# Patient Record
Sex: Female | Born: 1960 | Race: White | Hispanic: Yes | Smoking: Never smoker
Health system: Southern US, Community
[De-identification: ages and names within clinical notes are randomized; demographics above are authoritative.]

---

## 2016-12-14 ENCOUNTER — Emergency Department (HOSPITAL_COMMUNITY)
Admission: EM | Admit: 2016-12-14 | Discharge: 2016-12-14 | Disposition: A | Discharge status: Home / Self Care | Payer: No Typology Code available for payment source | Attending: Emergency Medicine | Admitting: Emergency Medicine

## 2016-12-14 ENCOUNTER — Emergency Department (HOSPITAL_COMMUNITY): Payer: No Typology Code available for payment source

## 2016-12-14 ENCOUNTER — Encounter (HOSPITAL_COMMUNITY): Payer: Self-pay | Admitting: *Deleted

## 2016-12-14 DIAGNOSIS — S098XXA Other specified injuries of head, initial encounter: Secondary | ICD-10-CM | POA: Insufficient documentation

## 2016-12-14 DIAGNOSIS — Y999 Unspecified external cause status: Secondary | ICD-10-CM | POA: Diagnosis not present

## 2016-12-14 DIAGNOSIS — Y939 Activity, unspecified: Secondary | ICD-10-CM | POA: Diagnosis not present

## 2016-12-14 DIAGNOSIS — Y92410 Unspecified street and highway as the place of occurrence of the external cause: Secondary | ICD-10-CM | POA: Insufficient documentation

## 2016-12-14 MED ORDER — TRAMADOL HCL 50 MG PO TABS
50.0000 mg | ORAL_TABLET | Freq: Four times a day (QID) | ORAL | 0 refills | Status: AC | PRN
Start: 2016-12-14 — End: ?

## 2016-12-14 MED ORDER — HYDROCODONE-ACETAMINOPHEN 5-325 MG PO TABS
1.0000 | ORAL_TABLET | Freq: Once | ORAL | Status: AC
  Administered 2016-12-14: 1 via ORAL
  Filled 2016-12-14: qty 1

## 2016-12-14 MED ORDER — IBUPROFEN 400 MG PO TABS: 400.0000 mg | ORAL_TABLET | Freq: Three times a day (TID) | ORAL | 0 refills | Status: AC

## 2016-12-14 MED ORDER — TRAMADOL HCL 50 MG PO TABS
50.0000 mg | ORAL_TABLET | Freq: Once | ORAL | Status: AC
  Administered 2016-12-14: 50 mg via ORAL
  Filled 2016-12-14: qty 1
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
## 2016-12-14 NOTE — ED Notes (Signed)
Walked patient to the bathroom patient did well patient back in bed with call bell in reach
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
## 2018-08-12 ENCOUNTER — Emergency Department
Admission: EM | Admit: 2018-08-12 | Discharge: 2018-08-12 | Disposition: A | Discharge status: Home / Self Care | Payer: 344 | Attending: Emergency Medical Services | Admitting: Emergency Medical Services

## 2018-08-12 DIAGNOSIS — R509 Fever, unspecified: Secondary | ICD-10-CM | POA: Insufficient documentation

## 2018-08-12 DIAGNOSIS — R197 Diarrhea, unspecified: Secondary | ICD-10-CM | POA: Insufficient documentation

## 2018-08-12 DIAGNOSIS — Z20828 Contact with and (suspected) exposure to other viral communicable diseases: Secondary | ICD-10-CM | POA: Insufficient documentation

## 2018-08-12 DIAGNOSIS — Z20822 Contact with and (suspected) exposure to covid-19: Secondary | ICD-10-CM

## 2018-08-12 MED ORDER — ACETAMINOPHEN 500 MG PO TABS
1000.0000 mg | ORAL_TABLET | Freq: Once | ORAL | Status: AC
Start: 2018-08-12 — End: 2018-08-12
  Administered 2018-08-12: 16:00:00 1000 mg via ORAL

## 2018-08-12 MED ORDER — SODIUM CHLORIDE 0.9 % IV BOLUS
1000.00 mL | Freq: Once | INTRAVENOUS | Status: DC
Start: 2018-08-12 — End: 2018-08-12

## 2018-08-12 NOTE — Discharge Instructions (Signed)
[facility] méd[facility] del departamento de emergencias de [place] para pacientes ambulatorios en espera del resultado de la prueba de la COVID-19     Gracias por elegir [facility]. Durante su visita, surgió la inquietud de una posible infección con el virus que produce la enfermedad COVID-19. Los resultados de la prueba para la COVID-19 tardarán de 3 a 7 días. Durante este período debe hacer todo lo siguiente:     ? Descanse y manténgase bien hidratado.  ? Use acetaminofén (Tylenol) para controlar la fiebre.  ? Lávese las manos con frecuencia.  - Lávese las manos con agua y jabón y con frecuencia por 20 segundos como mínimo, en especial después de soplarse la nariz, toser o estornudar, ir al baño y antes de comer o de cocinar.  - Si no tiene agua y jabón, use un desinfectante de manos con un mínimo de 60 % de alcohol, cubra toda la superficie de las manos y frótelas hasta que se sientan secas.  - El agua y el jabón son la mejor opción si las manos están visiblemente sucias. Evite tocarse los ojos, la nariz y la boca sin antes lavarse las manos.  ? Quédese en casa, excepto si [place] a recibir atención médica.  ? Llame con anticipación antes de ver a su médico o a cualquier otro proveedor médico o de buscar atención médica en cualquier centro de salud.  ? Aíslese tanto como sea posible. Los miembros de la familia de edad avanzada, las personas con enfermedades crónicas o las que toman medicamentos que afectan su sistema inmunológico corren un riesgo especial. Converse sobre su situación particular con su médico de cabecera.   ? Limpie todas las superficies de "alto contacto" todos los días.  - Las superficies de alto contacto incluyen encimeras, mesas, pomos de puertas, accesorios de baño, inodoros, teléfonos, teclados, tabletas y mesas de noche.  - Además, limpie cualquier superficie que pueda tener sangre, heces o fluidos corporales. Utilice un rociador o toallita de limpieza doméstica según las instrucciones de la  etiqueta.  - Las etiquetas contienen instrucciones para el uso seguro y efectivo del producto de limpieza, lo que incluye las precauciones que debe tener al aplicar el producto, como usar guantes y asegurarse de tener una buena ventilación durante el uso del producto.  - ¡Preste mucha atención a la limpieza de su teléfono celular durante todo el día!  ? La mayoría de las personas infectadas con la COVID-19 mejorarán, sin embargo, un pequeño número desarrollará una infección que empeora y que puede requerir un tratamiento más intensivo. Comuníquese con el médico o regrese al departamento de emergencias si desarrolla señales de advertencia de una infección más grave, como:   ? Una fiebre que no se controla con acetaminofén (Tylenol).  ? Dificultad para respirar o dolor en el pecho.  ? Empeoramiento de la tos.  ? Debilidad creciente.  ? Aspectos generales que debe tener en cuenta:  - Si tiene una emergencia médica y necesita llamar al 911, notifique al personal de despacho que tiene o está siendo evaluado por la COVID-19. Si es posible, póngase una mascarilla antes de que lleguen los servicios médicos de emergencia.  - Las personas que se sometan a un monitoreo activo o a una supervisión voluntaria facilitada deben seguir las instrucciones del departamento de salud local o de los profesionales de la salud ocupacional, según proceda.  - Antes de buscar atención, llame a su proveedor de atención médica y dígale que tiene o está siendo evaluado por la   COVID-19. Póngase una mascarilla antes de entrar en las instalaciones.  Estos pasos ayudarán a evitar que otras personas presentes en el consultorio del proveedor de atención médica o en la sala de espera se infecten o se expongan.   Llame a su supervisor en la escuela o el trabajo para obtener más instrucciones antes de volver a cualquier grupo mientras sufra los síntomas.    Recibo de los resultados de la prueba   Lo llamarán para informarle los resultados de su prueba de  la COVID-19. El Departamento de Salud de [place] o un miembro del equipo de [place] pueden llamarlo para darle este resultado, dependiendo del laboratorio que procese su prueba.      Asegúrese de que su información de contacto es correcta antes de salir del departamento de emergencias.      Descontinuar el aislamiento en el hogar  Los pacientes con la COVID-19 confirmada deben permanecer en aislamiento preventivo en casa hasta que se considere que el riesgo de transmisión secundaria a otros es bajo. La decisión de suspender el aislamiento preventivo en el hogar se debe tomar caso por caso, en consulta con proveedores de atención médica y los departamentos de salud estatales y locales. En general, se sugiere que espere, al menos, 3 días después de que todos los síntomas hayan desaparecido para volver a las actividades regulares.     Para obtener más información sobre lo que usted y los demás en su hogar deben hacer, visite el sitio web del Centro para el Control y la Prevención de Enfermedades (CDC, por sus siglas en inglés) en https://www.cdc.gov/coronavirus/2019-ncov/about/steps-when-sick.html       Clinics: [facility] [place] [facility] [place]  [place]  [facility]  [phone]  [address]  [place], [place] [address]  Fax: [phone]    [facility]  [phone]  [address]  [place], [place] [address]  Fax: [phone]    [facility] [place]  [phone]  [address]  3rd Floor  [place], [place] [address]  Fax: [phone]    [place]  [facility] [place]  [phone]   [address]  [place], [place] [address]  Fax: [phone]    [facility] - [place]  [phone]  [address], [place], [place] [address]  [address], [place], [place] [address]  [address], [place], [place] [address]  [address], [place], [place] [address]  Fax: [phone]    [facility] [place]  [phone]  [address]  [place], [place] [address]  Fax: [phone]    [facility] [place]  [phone]  [address]  [place], [place] [address]  Fax: [phone]    [place]  [facility] [place]  [phone]   [address]  [place], [place] [address]  Fax: [phone]     [facility]  [phone]  [employer]  [address]  [place], [place] [address]  Fax: [phone]    [facility] [place]  [phone]  [address]  [place], [place] [address]  Fax: [phone]    [place]  [facility]  [phone]  [address]  [place], [place] [address]  Fax: [phone]    [facility] [place] ([place])  [phone]   [address]  [place], [place] [address]  Fax: [phone]    [facility] [place] [facility] [place]    [phone]  [address] Third Floor  [place], [place] [address]    [place]  [facility] [place] [facility] [place]  [phone]  [address]  [place], [place] [address]     [facility]  [phone]  [address]  [place], [place] [address]  Fax: [phone]    [facility] [place]  [phone]  [address]  [place], [place] [address]  Fax: [phone]    [place]  [facility] [place]  [phone]  [address]  [place], [place] [address]  Fax: [phone]    [facility] [place]  [phone]  [address]  [place], [place] [address]  Fax: [phone]    [facility]  [phone]  [address]  [place], [place] [address]  Fax: [phone]    Outside [place]  [facility] [employer]  [phone]    [facility] [employer] - Patient Services   [phone]    [facility] - Patient  Relations  [phone]    Other Resources (non-primary care)  [facility] ([facility])  [address]  [place], [place] [address]  [phone]    [facility] (must be referred through certain agencies)  [place] and [place] locations  [phone]    [facility]  [address]  [place], [place] [address]  [phone]     [facility]  [address]  [place], [place] [address]  [phone]

## 2018-08-12 NOTE — ED Triage Notes (Signed)
Patient with diarrhea, fever and body aches past couple of days.  Alert x4.  No PMH

## 2018-08-12 NOTE — ED Provider Notes (Signed)
EMERGENCY DEPARTMENT HISTORY AND PHYSICAL EXAM     None        Date: 08/12/2018  Patient Name: Amanda Proctor  Attending Physician: He, Zadie Rhine, MD PhD  Advanced Practice Provider: Arnoldo Morale, PA-C    History of Presenting Illness       History Provided By: Patient  Interpreter: Spanish interpreter Amanda Proctor)    Chief Complaint:  Chief Complaint   Patient presents with    Fever    Diarrhea       HPI: Amanda Proctor is a 58 y.o. female otherwise healthy presenting to the ED with subjective fevers for the past six days. She also notes having a bitter taste in her mouth which she believes is associated with her fever, mild decreased appetite as a result. Pt's son is COVID positive and she wants to be tested. She last took Motrin at 11 AM today. Denies chest pain, SOB, abd pain, diarrhea, constipation.    PCP: Pcp, None, MD  SPECIALISTS:    No current facility-administered medications for this encounter.      No current outpatient medications on file.       Past History     Past Medical History:  No past medical history on file.    Past Surgical History:  History reviewed. No pertinent surgical history.    Family History:  No family history on file.    Social History:  Social History     Socioeconomic History    Marital status: Widowed     Spouse name: None    Number of children: None    Years of education: None    Highest education level: None   Occupational History    None   Social Engineer, site strain: None    Food insecurity:     Worry: None     Inability: None    Transportation needs:     Medical: None     Non-medical: None   Tobacco Use    Smoking status: Never Smoker    Smokeless tobacco: Never Used   Substance and Sexual Activity    Alcohol use: None    Drug use: None    Sexual activity: None   Lifestyle    Physical activity:     Days per week: None     Minutes per session: None    Stress: None   Relationships    Social connections:     Talks on phone: None      Gets together: None     Attends religious service: None     Active member of club or organization: None     Attends meetings of clubs or organizations: None     Relationship status: None    Intimate partner violence:     Fear of current or ex partner: None     Emotionally abused: None     Physically abused: None     Forced sexual activity: None   Other Topics Concern    None   Social History Narrative    None       Allergies:  No Known Allergies    Review of Systems     Review of Systems   Constitutional: Positive for fever.   Respiratory: Negative for shortness of breath.    Cardiovascular: Negative for chest pain.   Gastrointestinal: Negative for abdominal pain.       Physical Exam     Vitals:  08/12/18 1314   BP: 114/73   Pulse: 93   Resp: 15   Temp: 99.6 F (37.6 C)   TempSrc: Oral   SpO2: 97%   Weight: 81.6 kg       Physical Exam  Vitals signs and nursing note reviewed.   Constitutional:       Appearance: Normal appearance. She is well-developed.      Comments: Well-appearing, no acute distress.    HENT:      Head: Normocephalic and atraumatic.      Right Ear: External ear normal.      Left Ear: External ear normal.      Nose: Nose normal.   Eyes:      Pupils: Pupils are equal, round, and reactive to light.   Neck:      Musculoskeletal: Normal range of motion and neck supple.   Cardiovascular:      Rate and Rhythm: Normal rate and regular rhythm.      Pulses: Normal pulses.      Heart sounds: Normal heart sounds.   Pulmonary:      Effort: Pulmonary effort is normal.      Breath sounds: Normal breath sounds.   Abdominal:      Palpations: Abdomen is soft.      Tenderness: There is no abdominal tenderness.   Musculoskeletal: Normal range of motion.   Skin:     General: Skin is warm and dry.   Neurological:      Mental Status: She is alert and oriented to person, place, and time.   Psychiatric:         Behavior: Behavior normal.         Diagnostic Study Results     Labs -     Results     ** No results  found for the last 24 hours. **          Radiologic Studies -   Radiology Results (24 Hour)     ** No results found for the last 24 hours. **      .    Medical Decision Making   I am the first provider for this patient.    I reviewed the vital signs, available nursing notes, past medical history, past surgical history, family history and social history.    Vital Signs-Reviewed the patient's vital signs.     Patient Vitals for the past 12 hrs:   BP Temp Pulse Resp   08/12/18 1314 114/73 99.6 F (37.6 C) 93 15       Pulse Oximetry Analysis - Normal SpO2: 97 % on RA        Procedures:       Old Medical Records: Nursing notes.     ED Course:        Provider Notes:   58yoF presents with subjective fever x 6 days and covid+ contacts for a COVID test. Denies CP, SOB, weakness. Change in sense of taste but otherwise asymptomatic. COVID test sent, return precautions discussed     Diagnosis     Clinical Impression:   1. Suspected Covid-19 Virus Infection        Treatment Plan:   ED Disposition     ED Disposition Condition Date/Time Comment    Discharge  Mon Aug 12, 2018  3:51 PM [facility] discharge to home/self care.    Condition at disposition: Stable            _______________________________    CHART OWNERSHIP:  I, Zoye Chandra Samo-Lipman, PA-C, am the primary clinician of record.  _______________________________     Arnoldo Morale, [place]  08/12/18 2209

## 2018-08-13 LAB — CORONAVIRUS, COVID-19: SARS CoV 2 Overall Result: NOT DETECTED

## 2018-08-14 ENCOUNTER — Telehealth (INDEPENDENT_AMBULATORY_CARE_PROVIDER_SITE_OTHER): Payer: Self-pay

## 2018-08-14 ENCOUNTER — Emergency Department: Payer: 344

## 2018-08-14 ENCOUNTER — Emergency Department
Admission: EM | Admit: 2018-08-14 | Discharge: 2018-08-14 | Disposition: A | Discharge status: Home / Self Care | Payer: 344 | Attending: Emergency Medicine | Admitting: Emergency Medicine

## 2018-08-14 DIAGNOSIS — Z20828 Contact with and (suspected) exposure to other viral communicable diseases: Secondary | ICD-10-CM | POA: Insufficient documentation

## 2018-08-14 DIAGNOSIS — R6889 Other general symptoms and signs: Secondary | ICD-10-CM

## 2018-08-14 DIAGNOSIS — R05 Cough: Secondary | ICD-10-CM | POA: Insufficient documentation

## 2018-08-14 DIAGNOSIS — R0602 Shortness of breath: Secondary | ICD-10-CM | POA: Insufficient documentation

## 2018-08-14 DIAGNOSIS — Z20822 Contact with and (suspected) exposure to covid-19: Secondary | ICD-10-CM

## 2018-08-14 DIAGNOSIS — R509 Fever, unspecified: Secondary | ICD-10-CM | POA: Insufficient documentation

## 2018-08-14 DIAGNOSIS — I517 Cardiomegaly: Secondary | ICD-10-CM

## 2018-08-14 LAB — ECG 12-LEAD
Atrial Rate: 88 {beats}/min
P Axis: 12 degrees
P-R Interval: 158 ms
Q-T Interval: 358 ms
QRS Duration: 70 ms
QTC Calculation (Bezet): 433 ms
R Axis: 4 degrees
T Axis: 96 degrees
Ventricular Rate: 88 {beats}/min

## 2018-08-14 LAB — CBC AND DIFFERENTIAL
Absolute NRBC: 0 10*3/uL (ref 0.00–0.00)
Basophils Absolute Automated: 0.01 10*3/uL (ref 0.00–0.08)
Basophils Automated: 0.1 %
Eosinophils Absolute Automated: 0 10*3/uL (ref 0.00–0.44)
Eosinophils Automated: 0 %
Hematocrit: 35.8 % (ref 34.7–43.7)
Hgb: 12 g/dL (ref 11.4–14.8)
Immature Granulocytes Absolute: 0.04 10*3/uL (ref 0.00–0.07)
Immature Granulocytes: 0.5 %
Lymphocytes Absolute Automated: 1.56 10*3/uL (ref 0.42–3.22)
Lymphocytes Automated: 19.5 %
MCH: 29.6 pg (ref 25.1–33.5)
MCHC: 33.5 g/dL (ref 31.5–35.8)
MCV: 88.4 fL (ref 78.0–96.0)
MPV: 9.7 fL (ref 8.9–12.5)
Monocytes Absolute Automated: 0.29 10*3/uL (ref 0.21–0.85)
Monocytes: 3.6 %
Neutrophils Absolute: 6.1 10*3/uL (ref 1.10–6.33)
Neutrophils: 76.3 %
Nucleated RBC: 0 /100 WBC (ref 0.0–0.0)
Platelets: 215 10*3/uL (ref 142–346)
RBC: 4.05 10*6/uL (ref 3.90–5.10)
RDW: 12 % (ref 11–15)
WBC: 8 10*3/uL (ref 3.10–9.50)

## 2018-08-14 LAB — COMPREHENSIVE METABOLIC PANEL
ALT: 36 U/L (ref 0–55)
AST (SGOT): 34 U/L (ref 5–34)
Albumin/Globulin Ratio: 1 (ref 0.9–2.2)
Albumin: 3.4 g/dL — ABNORMAL LOW (ref 3.5–5.0)
Alkaline Phosphatase: 92 U/L (ref 37–106)
BUN: 8 mg/dL (ref 7.0–19.0)
Bilirubin, Total: 0.4 mg/dL (ref 0.2–1.2)
CO2: 21 mEq/L — ABNORMAL LOW (ref 22–29)
Calcium: 8 mg/dL — ABNORMAL LOW (ref 8.5–10.5)
Chloride: 106 mEq/L (ref 100–111)
Creatinine: 0.7 mg/dL (ref 0.6–1.0)
Globulin: 3.5 g/dL (ref 2.0–3.6)
Glucose: 113 mg/dL — ABNORMAL HIGH (ref 70–100)
Potassium: 4.1 mEq/L (ref 3.5–5.1)
Protein, Total: 6.9 g/dL (ref 6.0–8.3)
Sodium: 139 mEq/L (ref 136–145)

## 2018-08-14 LAB — GFR: EGFR: >60

## 2018-08-14 LAB — PT AND APTT
PT INR: 1 (ref 0.9–1.1)
PT: 13.5 s (ref 12.6–15.0)
PTT: 30 s (ref 23–37)

## 2018-08-14 LAB — TROPONIN I: Troponin I: <0.01 ng/mL (ref 0.00–0.05)

## 2018-08-14 LAB — B-TYPE NATRIURETIC PEPTIDE: B-Natriuretic Peptide: 68 pg/mL (ref 0–100)

## 2018-08-14 LAB — LACTIC ACID, PLASMA: Lactic Acid: 1.2 mmol/L (ref 0.2–2.0)

## 2018-08-14 LAB — IHS D-DIMER: D-Dimer: 0.55 ug/mL FEU (ref 0.00–0.70)

## 2018-08-14 LAB — MAGNESIUM: Magnesium: 1.7 mg/dL (ref 1.6–2.6)

## 2018-08-14 NOTE — ED Triage Notes (Signed)
Pt reports being tested for COVID-19 two days ago with negative result.  Pt reports difficulty performing simple ADLs due to fatigue and sob.

## 2018-08-14 NOTE — ED Provider Notes (Signed)
[facility] EMERGENCY DEPARTMENT H&P      Visit date: 08/14/2018      CLINICAL SUMMARY           Diagnosis:    .     Final diagnoses:   Suspected 2019 Novel Coronavirus Infection         MDM Notes:      51F with fever, cough, sore throat. + sick contacts. Likely COVID. No other localizing symptoms.     No indication for oxygen or admission at this point in time. Discussed isolation, returned for increased work of breathing. D/c'd in stable condition.          Disposition:         Discharge           I was present for the bedside discharge alongside the patient's ED nurse. Course of visit in the ED and discharge instructions were reviewed with patient and they were given the opportunity to ask any questions regarding their care today. Patient and/ or patient's family verbalized understanding of, and comfort with, instructions and plan.          Discharge Prescriptions     None                         CLINICAL INFORMATION        HPI:      Chief Complaint: Shortness of Breath; Fever; and Diarrhea  .    Aneita Proctor is a 58 y.o. female who presents with fever, cough, shortness of breath. Symptoms x  8 days. Tested at [place] and negative. Continued fevers. Son is COVID +. Also with decreased appetite and had reported looser stools at [facility] visit.     No smoking,     Denies medications, chronic medical conditions.       History obtained from: Patient with interpreter.           ROS:      Pertinent positive and negative review of systems elements noted in the HPI.  All other systems reviewed and negative.        Physical Exam:      Vitals:    08/14/18 0938 08/14/18 0952 08/14/18 1130 08/14/18 1230   BP:  126/69 143/79 112/65   Pulse: (!) 106 86 85 87   Resp:  (!) 28 (!) 24 18   Temp:  100.2 F (37.9 C)  100.4 F (38 C)   TempSrc:  Oral  Oral   SpO2: 92% 97% 94% 96%   Weight:       Height:           Nursing notes and vitals reviewed.     Constitutional: alert, no acute  distress  Eyes: EOM intact, conjugate gaze, visual acuity grossly intact  HEENT: Neck supple with normal ROM  Cardiac: Warm and well perfused  Respiratory: Normal rate, No increased work of breathing   Abdomen/GI:  nondistended  Neurologic: alert, oriented, conversant, moves all extremities, stable gait  MSK: No deformities or crepitus, full ROM of joints without pain or limitation  Skin: warm and dry, no obvious rashes  Psychiatric: pleasant and cooperative              PAST HISTORY        Primary Care Provider: Pcp, None, MD        PMH/PSH:    .     History reviewed. No pertinent past medical history.  She has no past surgical history on file.      Social/Family History:      She reports that she has never smoked. She has never used smokeless tobacco. She reports that she does not drink alcohol or use drugs.    History reviewed. No pertinent family history.      Listed Medications on Arrival:    .     Home Medications     Med List Status:  In Progress Set By: Lovey Newcomer, RN at 08/14/2018  9:35 AM        No Medications         Allergies: She has No Known Allergies.            VISIT INFORMATION        Clinical Course in the ED:          ED Course as of Aug 15 1298   Wed Aug 14, 2018   1101 Fever x 11 days, cough, some Sob. + sick contacts. Tested negative a few days ago.     Since has continued to have fever, some cough, bitter taste in mouth. Son + for COVID and lives with her.     Denies other localizing symtpoms.     No smoking, no diabetes, no htn.     [RS]      ED Course User Index  [RS] Wandra Feinstein, MD         Medications Given in the ED:    .     ED Medication Orders (From admission, onward)    None            Procedures:      Procedures      Interpretations:      O2 sat-           saturation: 95 %; Oxygen use: room air; Interpretation: borderline normal       Radiology -     interpreted by me with the following observations: CXR with mild b/L opacities c/w COVID  EKG -             interpreted by  me: Sinus rhythm at 88, T wave inversion in aVL, no acute ST or T wave changes, normal intervals, normal axis  Monitor -         interpreted by me: ST in 100s                 RESULTS        Lab Results:      Results     Procedure Component Value Units Date/Time    Troponin I [[id number]] Collected:  08/14/18 1029    Specimen:  Blood Updated:  08/14/18 1145     Troponin I <0.01 ng/mL     B-type Natriuretic Peptide [[id number]] Collected:  08/14/18 1029    Specimen:  Blood Updated:  08/14/18 1145     B-Natriuretic Peptide 68 pg/mL     Comprehensive metabolic panel [[id number]]  (Abnormal) Collected:  08/14/18 1029    Specimen:  Blood Updated:  08/14/18 1137     Glucose 113 mg/dL      BUN 8.0 mg/dL      Creatinine 0.7 mg/dL      Sodium 295 mEq/L      Potassium 4.1 mEq/L      Chloride 106 mEq/L      CO2 21 mEq/L      Calcium 8.0 mg/dL      Protein,  Total 6.9 g/dL      Albumin 3.4 g/dL      AST (SGOT) 34 U/L      ALT 36 U/L      Alkaline Phosphatase 92 U/L      Bilirubin, Total 0.4 mg/dL      Globulin 3.5 g/dL      Albumin/Globulin Ratio 1.0    Magnesium [[id number]] Collected:  08/14/18 1029    Specimen:  Blood Updated:  08/14/18 1137     Magnesium 1.7 mg/dL     GFR [[id number]] Collected:  08/14/18 1029     Updated:  08/14/18 1137     EGFR >60.0    D-Dimer [[id number]] Collected:  08/14/18 1029     Updated:  08/14/18 1122     D-Dimer 0.55 ug/mL FEU     Rapid influenza A/B antigens [[id number]] Collected:  08/14/18 1029    Specimen:  Nasopharyngeal Swab from Nasopharynx Updated:  08/14/18 1120    Narrative:       ORDER#: [id number]                                    ORDERED BY: Vickki Hearing, Avrohom Mckelvin  SOURCE: Nasopharynx                                  COLLECTED:  08/14/18 10:29  ANTIBIOTICS AT COLL.:                                RECEIVED :  08/14/18 11:03  Influenza Rapid Antigen A&B                FINAL       08/14/18 11:20  08/14/18   Negative for Influenza A and B             Reference Range: Negative      PT/APTT [[id number]]  Collected:  08/14/18 1029     Updated:  08/14/18 1120     PT 13.5 sec      PT INR 1.0     PTT 30 sec     CBC and differential [[id number]] Collected:  08/14/18 1029    Specimen:  Blood Updated:  08/14/18 1110     WBC 8.00 x10 3/uL      Hgb 12.0 g/dL      Hematocrit 56.3 %      Platelets 215 x10 3/uL      RBC 4.05 x10 6/uL      MCV 88.4 fL      MCH 29.6 pg      MCHC 33.5 g/dL      RDW 12 %      MPV 9.7 fL      Neutrophils 76.3 %      Lymphocytes Automated 19.5 %      Monocytes 3.6 %      Eosinophils Automated 0.0 %      Basophils Automated 0.1 %      Immature Granulocyte 0.5 %      Nucleated RBC 0.0 /100 WBC      Neutrophils Absolute 6.10 x10 3/uL      Abs Lymph Automated 1.56 x10 3/uL      Abs Mono Automated 0.29 x10 3/uL      Abs Eos Automated 0.00 x10 3/uL  Absolute Baso Automated 0.01 x10 3/uL      Absolute Immature Granulocyte 0.04 x10 3/uL      Absolute NRBC 0.00 x10 3/uL     Lactic acid, plasma [[id number]] Collected:  08/14/18 1029    Specimen:  Blood Updated:  08/14/18 1055     Lactic acid 1.2 mmol/L               Radiology Results:      XR Chest AP Only   Final Result    Bibasilar infiltrates.      Nasim Garofano    08/14/2018 10:21 AM                  Scribe Attestation:      No scribe was involved in the care of this patient.             Wandra Feinstein, MD  08/16/18 [phone]

## 2018-08-14 NOTE — ED Notes (Signed)
X-ray at bedside

## 2018-08-14 NOTE — Telephone Encounter (Signed)
COVID-19 Test Results Notification Team    Spoke with patient to provide NEGATIVE COVID-19 test result.      After verifying patient name, address and DOB, patient was provided with the negative result.    Patient's questions were answered and patient was provided with the COVID-19 Results Call Center number in the event that patient has any additional questions or concerns.     Confirmed that patient's primary care physician is Ramond Marrow at phone number [phone].  Routed Epic note to primary care physician.     Patient was advised to follow CDC guidance on social distancing and hand washing.    Advised patient to contact their Primary Care Physician in the event that patient's symptoms change or worsen.      Reminded patient that the emergency room is always available in the event of an emergency.      Brenton Grills  LCSW-A, CSAC  COVID-19 Notification Team  Direct Line:  [phone] Test Results Call Center:  [phone]

## 2018-08-14 NOTE — Discharge Instructions (Signed)
Gracias por elegir el departamento de emergencias (Emergency Department) de [facility], el primero en el área de [place].  Espero que su consulta de hoy haya sido EXCELENTE.      Thank you for choosing the [facility] Emergency Department, the premier emergency department in the [place] area.  I hope your visit today was EXCELLENT.                       Instrucciones específicas para su consulta de hoy:      Specific instructions for your visit today:        Instrucciones del alta para la COVID-19     Gracias por elegir [facility].  Durante su consulta, su equipo de atención médica tuvo la preocupación de que pudiera estar infectado con el virus que causa la enfermedad del coronavirus 2019 (COVID-19).  Se le realizó la prueba para detectar este virus y es posible que ya se tengan los resultados.    ¿Qué es la COVID-19?  La COVID-19 es la enfermedad causada por un coronavirus llamado Síndrome Respiratorio Agudo Grave por Coronavirus 2 (SARS-CoV-2, por sus siglas en inglés). El virus es muy contagioso y se puede propagar a través de la tos, los estornudos e incluso al hablar.  También se puede propagar mediante el contacto personal cercano o a través de superficies contaminadas.  Se cree que el virus que causa la COVID-19 puede permanecer en objetos y superficies durante varios días, pero no se sabe con certeza la cantidad de tiempo que permanece infeccioso.  Debido a cómo el virus entra en el cuerpo, es importante evitar tocarse la cara, la boca y la nariz.  También es importante lavarse las manos o utilizar desinfectante de manos frecuentemente y limpiar a fondo las superficies de alto contacto.  El distanciamiento social y el aislamiento son clave para disminuir la propagación del virus.    ¿Cómo recibiré los resultados de mi prueba?   Si durante la hospitalización se le realizó la prueba para la COVID-19 y el resultado sigue pendiente, recibirá una llamada por parte del equipo de  notificación de resultados de la COVID-19 en cuanto el resultado final esté listo.  Asegúrese de que el número de teléfono que dio cuando se registró sea el correcto para que puedan comunicarse con usted.  También puede registrarse en My Chart para consultar el resultado en línea cuando esté disponible.  Le pedimos amablemente que tenga paciencia, ya que los resultados de su prueba para la COVID-19 pueden tardar varios días en ser procesados.  Si desea hablar con un miembro del equipo, puede comunicarse con el centro de llamadas del hospital para conocer resultados de pruebas para la COVID-19 a través del [phone], de lunes a viernes, de 8:30 a. m. a 5:00 p. m.    Cuidado en el hogar - Aislamiento:  ? Quédese en casa, excepto si [place] a recibir atención médica. Debe restringir todas las actividades fuera de casa. No vaya a su lugar de trabajo, la escuela o lugares públicos. Evite el uso del transporte público, transporte compartido o tomar taxis.  ? Aíslese de las personas que viven en su casa tanto como sea posible.  Las personas infectadas deben aislarse de los demás que viven en la misma casa, instalándose en un dormitorio o espacio específico "para el enfermo" y utilizando un baño diferente (si es posible).  ? Los familiares de la tercera edad, las personas con enfermedades crónicas como la diabetes y   aquellas que toman medicamentos que afectan su sistema inmunológico corren un riesgo especialmente alto.  Infórmele al médico de atención primaria de las personas que viven con usted que corran un riesgo más alto sobre la posibilidad de que usted esté infectado.  ? Mientras esté enfermo de la COVID-19, debe evitar el contacto con mascotas y otros animales.  No se sabe con certeza si las mascotas u otros animales pueden contraer la COVID-19 o propagar el virus.  Se recomienda que las personas con COVID-19 eviten el contacto con animales hasta que se tenga más información sobre el virus.  ? Póngase una mascarilla de  tela cuando deba salir a lugares públicos. Para obtener instrucciones sobre cómo elaborar su propia mascarilla, visite https://www.cdc.gov/coronavirus/2019-ncov/prevent-getting-sick/diy-cloth-face-coverings.html.  ? Llame con anticipación antes de ver a su médico o a cualquier otro proveedor médico o de buscar atención médica en cualquier centro de salud.  Debe informarles que le diagnosticaron COVID-19 o le están haciendo pruebas para detectar la enfermedad. SI LLAMA AL 911, INFORME QUE LE DIAGNOSTICARON COVID-19 O LE ESTÁN HACIENDO PRUEBAS PARA DETECTAR LA ENFERMEDAD.    Cuidado del hogar - limpieza:  ? Evite tocarse los ojos, la nariz, la boca u otras partes de la cara.  ? Lávese las manos con frecuencia, especialmente después de sonarse la nariz, toser, estornudar, ir al baño y antes de comer o preparar alimentos.  -      Frótese las manos con agua y jabón durante, al menos, 20 segundos.  El agua y el jabón son la mejor opción para lavarse las manos si las tiene visiblemente sucias.  -      Si no puede lavarse con agua y jabón, use un desinfectante de manos a base de alcohol con, al menos, un 60 % de alcohol y cubra toda la superficie de las manos frotándolas hasta que se sientan secas.  ? Evite compartir artículos de uso personal con otras personas. No comparta platos, vasos, tazas, cubiertos, toallas o ropa de cama con otras personas o mascotas en su casa. Estos artículos se deben lavar bien con agua y jabón después de cada uso.  ? Limpie todas las superficies "de alto contacto" todos los días con un desinfectante en aerosol o toallitas húmedas desinfectantes según las instrucciones de la etiqueta.  - Las superficies de alto contacto incluyen encimeras, mesas, pomos de puertas, accesorios de baño, inodoros, teléfonos, teclados, tabletas y mesas de noche. ¡Preste mucha atención a la limpieza de su teléfono celular durante todo el día!  -     Limpie cualquier superficie que pueda tener sangre, excremento u otros  fluidos corporales.    Cuidado en el hogar - manejo de los síntomas:  ? Descanse y evite actividad física intensa.  ? La fiebre se puede tratar con acetaminofén (Tylenol). No exceda la dosis total recomendada en la etiqueta del medicamento.  Evite los medicamentos antiinflamatorios no esteroideos (NSAID, por sus siglas en inglés) como ibuprofeno (Motrin, Advil, etc.) y naproxeno (Naprosyn, Aleve, etc.).  ? Es posible que su equipo de atención médica le haya recomendado medicamentos para la tos.  Antes de comenzar a tomar cualquier otro medicamento de venta libre para la tos o el resfriado debe ponerse en contacto con un proveedor médico.      ¿Qué se debe tener en cuenta?  La mayoría de las personas con la COVID-19 se recuperan, y muchas presentan solo síntomas leves. Sin embargo, un número importante desarrolla infecciones y síntomas graves que requieren monitoreo constante y tratamiento en   el hospital.  Comuníquese con su médico o regrese al departamento de emergencias si desarrolla señales de advertencia de una infección más grave, como:   ? Una fiebre alta que no se logra controlar con acetaminofén (Tylenol).  ? Dificultad para respirar que empeora y que le impide realizar actividades que normalmente podría hacer.   ? Debilidad y fatiga agudas.  ? Dolor en el pecho que empeora.   ? Empeoramiento de la tos.  ? Labios que se tornan azules, blancos o grises.  ? Confusión o somnolencia de reciente aparición.  ? Síntomas parecidos a los de un ataque cerebral como adormecimiento o debilidad repentina en la cara, los brazos o las piernas, confusión, problemas para ver o caminar, pérdida de equilibrio o dolor de cabeza repentino e intenso.  ? Decoloración azul, blanca o gris de reciente aparición en la piel y sensación de frío en las extremidades (brazos, manos, piernas y pies).  ? Esta lista no incluye todos los síntomas. Consulte con su proveedor médico sobre cualquier otro síntoma grave que  presente.      Descontinuar el aislamiento domiciliario  Los pacientes confirmados con COVID-19 deben permanecer en aislamiento domiciliario hasta que se considere que el riesgo de contagiar a otras personas es muy bajo. Para dar fin al aislamiento domiciliario, los Centros para el Control y la Prevención de Enfermedades (CDC, por sus siglas en inglés) recomiendan que se cumpla con todos los siguientes criterios:    ? No haber presentado fiebre durante 72 horas como mínimo (es decir, tres días completos sin fiebre y sin tomar acetaminofén)  Y  ? que otros síntomas hayan mejorado (por ejemplo, mejoría de la tos o de la dificultad para respirar)  Y   ? que hayan pasado, al menos, 10 días desde que aparecieron los síntomas por primera vez.    Es posible que haya otras recomendaciones o restricciones estatales o locales vigentes para el aislamiento que debe respetar.    Si no está seguro si puede interrumpir su aislamiento domiciliario, comuníquese con su médico de atención primaria para recibir orientación.    Para obtener más información sobre lo que usted y los demás en su hogar deben hacer, visite el sitio web de los Centros para el Control y la Prevención de Enfermedades (CDC) en https://www.cdc.gov/coronavirus/2019-ncov/about/steps-when-sick.html.          SI USTED NO SIGUE MEJORANDO O SI SU CONDICIÓN EMPEORA, COMUNÍQUESE CON SU MÉDICO O ACUDA DE INMEDIATO AL DEPARTAMENTO DE EMERGENCIAS.        IF YOU DO NOT CONTINUE TO IMPROVE OR YOUR CONDITION WORSENS, PLEASE CONTACT YOUR DOCTOR OR RETURN IMMEDIATELY TO THE EMERGENCY DEPARTMENT.                    Atentamente,  Mathilda Maguire G, MD  Médico especialista en emergencias  Departamento de emergencias de [facility]        Sincerely,  Astin Rape G, MD  Attending Emergency Physician  [facility] Emergency Department                FARMACIA EN EL HOSPITAL  Nuestro servicio de farmacia en el hospital se encuentra en el área de espera de la  sala de emergencias.  Abre los 7 días de la semana de 9 am a 9 pm.  Trabajamos con los seguros médicos más importantes y nuestros precios son competitivos en comparación con los de otros proveedores.  Pida a su proveedor que imprima su prescripción para la farmacia,   de modo que pueda llegar a casa más rápido.     ONSITE PHARMACY  Our full service onsite pharmacy is located in the ER waiting room.  Open 7 days a week from 9 am to 9 pm.  We accept all major insurances and prices are competitive with major retailers.  Ask your provider to print your prescriptions down to the pharmacy to speed you on your way home.             CÓMO OBTENER UNA CITA PARA RECIBIR ATENCIÓN MÉDICA PRIMARIA    Los médicos de atención primaria (PCP, por sus siglas en inglés) son internistas o médicos de cabecera. Ambos tipos de PCP se enfocan en el fomento de la salud, la prevención de enfermedades, la educación y la asesoría del paciente y el tratamiento de condiciones médicas agudas y crónicas.    Llame para concertar una cita con un médico de atención primaria.  Pregunte qué médico está recibiendo pacientes nuevos.     [facility]  Teléfono: [phone]  [email or web address]     OBTAINING A PRIMARY CARE APPOINTMENT    Primary care physicians (PCPs, also known as primary care doctors) are either internists or family medicine doctors. Both types of PCPs focus on health promotion, disease prevention, patient education and counseling, and treatment of acute and chronic medical conditions.    Call for an appointment with a primary care doctor.  Ask to see who is taking new patients.       [facility]  telephone:  [phone]  [email or web address]             REMISIONES A MÉDICOS  Llame al [phone] (disponible las 24 horas al día, los 7 días de la semana) si necesita una remisión y podremos ayudarlo a encontrar un médico de atención primaria o un especialista.  También está disponible en línea en la página web:  [email or web address]     DOCTOR REFERRALS  Call [phone] (available 24 hours a day, 7 days a week) if you need any further referrals and we can help you find a primary care doctor or specialist.  Also, available online at:  [email or web address]             INFORMACIÓN DE CONTACTO  Antes de marcharse, verifique en el registro que su número de contacto esté actualizado.  Puede llamar al registro al [phone] para actualizar su información.  Si tiene preguntas sobre la factura del hospital, llame al [phone].  Si tiene preguntas sobre la factura del médico del departamento de emergencias, llame al [phone].       YOUR CONTACT INFORMATION  Before leaving please check with registration to make sure we have an up-to-date contact number.  You can call registration at [phone] to update your information.  For questions about your hospital bill, please call [phone].  For questions about your Emergency Dept Physician bill please call [phone].               SERVICIOS MÉDICOS GRATUITOS  Si necesita ayuda con servicios médicos o sociales, llame al 2-1-1 para recibir información gratuita sobre los recursos en su área.  2-1-1 es un servicio gratuito que brinda a las personas información sobre seguros médicos, clínicas gratuitas, embarazo, salud mental, atención dental, apoyo alimentario, vivienda y asesoría para tratar la drogadicción.  También está disponible en línea en la página web: [email or web address].     FREE HEALTH SERVICES  If you need help with health or social services, please call 2-1-1 for   a free referral to resources in your area.  2-1-1 is a free service connecting people with information on health insurance, free clinics, pregnancy, mental health, dental care, food assistance, housing, and substance abuse counseling.  Also, available online at:  [email or web address]             EXPEDIENTES MÉDICOS Y PRUEBAS  Los resultados de algunas  pruebas de laboratorio no se reciben el mismo día, como los del cultivo de orina.   Nos comunicaremos con usted si observamos otros hallazgos de importancia.  Las placas de radiología se suelen revisar dos veces para garantizar la exactitud.  Si existe alguna discrepancia, se lo notificaremos.      Llame al [phone] para recoger un disco compacto (CD) de obsequio con los estudios radiológicos que se le hicieron.  Si usted o su médico desean solicitar una copia de su expediente médico, llame al [phone].       MEDICAL RECORDS AND TESTS  Certain laboratory test results do not come back the same day, for example urine cultures.   We will contact you if other important findings are noted.  Radiology films are often reviewed again to ensure accuracy.  If there is any discrepancy, we will notify you.      Please call [phone] to pick up a complimentary CD of any radiology studies performed.  If you or your doctor would like to request a copy of your medical records, please call [phone].             LESIONES ORTOPÉDICAS   Tenga en cuenta que puede haber lesiones considerables incluso en casos en los que una radiografía inicial arroje resultados normales o negativos.  Esto puede deberse a que algunas fracturas (huesos rotos) no son visibles inicialmente en las radiografías.  Por este motivo, es necesario que el médico de atención primaria o un especialista en huesos (ortopeda o traumatólogo) le hagan un seguimiento ambulatorio detallado.     ORTHOPEDIC INJURY   Please know that significant injuries can exist even when an initial x-ray is read as normal or negative.  This can occur because some fractures (broken bones) are not initially visible on x-rays.  For this reason, close outpatient follow-up with your primary care doctor or bone specialist (orthopedist) is required.             MEDICAMENTOS Y SEGUIMIENTO  Tenga en cuenta que algunos medicamentos prescritos pueden producir somnolencia.  Sea cuidadoso  al conducir u operar maquinaria mientras toma estos medicamentos.    Las evaluaciones y los tratamientos que recibió en nuestro departamento de emergencias se proveen en caso de emergencia y no tienen el propósito de reemplazar a su médico de atención primaria.  Es importante que su médico lo examine de nuevo y que usted le notifique cualquier problema nuevo o que persista.       MEDICATIONS AND FOLLOWUP  Please be aware that some prescription medications can cause drowsiness.  Use caution when driving or operating machinery.    The examination and treatment you have received in our Emergency Department is provided on an emergency basis, and is not intended to be a substitute for your primary care physician.  It is important that your doctor checks you again and that you report any new or remaining problems at that time.               FARMACIAS DE 24 HORAS AL DÍA  La farmacia de 24 horas al día más cercana es:      [facility] [place]  [address]  [place], [place] [address]  [phone]     24 HOUR PHARMACIES  The nearest 24 hour pharmacy is:    [facility] [place]  [address]  [place], [place] [address]  [phone]             ASISTENCIA CON EL SEGURO    Ley de Atención de Salud Asequible (ACA, por sus siglas en inglés)  Llame para comenzar o terminar una solicitud, comparar planes, inscribirse o hacer preguntas.  1-800-318-2596  TTY: [phone]  Página web: Healthcare.gov    Ayuda para inscribirse en Medicaid  Cover [place]  [phone] (LÍNEA GRATUITA)  [phone] (TTY)  Página web: http://www.coverva.org    Ayuda local para inscribirse en la ACA  [place] [employer] ([employer])  [phone] (CENTRAL TELEFÓNICA)  Correo electrónico: health-help@nvfs.org  Página web: http://www.nvfs.org  Dirección: [address], [address] [place], [place] [address]     ASSISTANCE WITH INSURANCE    Affordable Care Act  (ACA)  Call to start or finish an application, compare plans,  enroll or ask a question.  1-800-318-2596  TTY: 1-855-889-4325  Web:  Healthcare.gov    Help Enrolling in Medicaid  Cover [place]  ([phone] [phone] (TOLL-FREE)  ([phone] [phone] (TTY)  Web:  Http://www.coverva.org    Local Help Enrolling in the ACA  [place] [facility]  [phone] (MAIN)  Email:  health-help@nvfs.org  Web:  Http://www.nvfs.org  Address:  [address], [address] [place], [place] [address]             MEDICAMENTOS SEDANTES  Los medicamentos sedantes abarcan medicamentos fuertes para el dolor (p. ej., narcóticos), relajantes musculares, benzodiacepinas (se usan para combatir la ansiedad y como relajante muscular), Benadryl o difenhidramina y demás antihistamínicos que se usan para combatir las reacciones alérgicas o la comezón y otros medicamentos.  Si no está seguro si se le suministró un medicamento sedante, pregunte a su médico o enfermero(a).  Si se le suministró un medicamento sedante: NO conduzca un automóvil. NO opere maquinaria. NO realice trabajos en los que deba estar alerta.  NO consuma bebidas alcohólicas mientras tome este medicamento.   SEDATING MEDICATIONS  Sedating medications include strong pain medications (e.g. narcotics), muscle relaxers, benzodiazepines (used for anxiety and as muscle relaxers), Benadryl/diphenhydramine and other antihistamines for allergic reactions/itching, and other medications.  If you are unsure if you have received a sedating medication, please ask your physician or nurse.  If you received a sedating medication: DO NOT drive a car. DO NOT operate machinery. DO NOT perform jobs where you need to be alert.  DO NOT drink alcoholic beverages while taking this medicine.               Si se siente mareado, siéntese o recuéstese al observar los primeros síntomas. Suba y baje las escaleras con cuidado.  Tenga extremo cuidado para evitar caídas.   If you get dizzy, sit or lie down at the first signs. Be careful going up and down stairs.  Be extra  careful to prevent falls.             Nunca dé este medicamento a otras personas.   Never give this medicine to others.             Mantenga este medicamento fuera del alcance de los niños.   Keep this medicine out of reach of children.               No tome o guarde medicamentos viejos. Deséchelos cuando se venzan.     Guarde los medicamentos en un lugar seco y fresco. NO los guarde en el gabinete de medicinas del baño o en un gabinete que se encuentre encima de la estufa.   Do not take or save old medicines. Throw them away when outdated.     Keep all medicines in a cool, dry place. DO NOT keep them in your bathroom medicine cabinet or in a cabinet above the stove.             RESURTIDO DE MEDICAMENTOS  Tenga en cuenta que no podemos resurtir medicamentos prescritos en la sala de emergencias. Si necesita un tratamiento adicional al que se le da en la sala de emergencias, consulte a su médico de atención primaria o al especialista en el tratamiento del dolor.   MEDICATION REFILLS  Please be aware that we cannot refill any prescriptions through the ER. If you need further treatment from what is provided at your ER visit, please follow up with your primary care doctor or your pain management specialist.             DEPARTAMENTOS INDEPENDIENTES DE EMERGENCIAS DE [facility]  ¿Sabía que [place] cuenta con dos salas de emergencias independientes a unas cuantas millas?  En la sala de emergencias de [place] en [place] y en la sala de emergencias en [place] el tiempo de espera es corto, el estacionamiento es gratis en frente del edificio y reciben altas calificaciones de satisfacción por parte de los pacientes. Igualmente, estas cuentan con los mismos médicos de emergencias certificados de [facility].     FREESTANDING EMERGENCY DEPARTMENTS OF [facility]  Did you know [place] has two freestanding ERs located just a few miles away?  [facility] of [place] and [facility] of  [place] have short wait times, easy free parking directly in front of the building and top patient satisfaction scores - and the same Board Certified Emergency Medicine doctors as  [facility].

## 2018-08-16 LAB — CORONAVIRUS, COVID-19: SARS CoV 2 Overall Result: NOT DETECTED

## 2018-08-18 ENCOUNTER — Telehealth: Payer: Self-pay

## 2018-08-18 NOTE — Telephone Encounter (Addendum)
COVID-19 Test Results Call Center    Received call from patient requesting their COVID-19 test results and requesting a spanish interpreter.  Writer called patient back with Spanish interpreter Mikle Bosworth  [phone]    After verifying patient name, address and DOB, patient was provided with the NEGATIVE result.    Patient's questions were answered.    Patient confirmed that they do not have a primary care physician.  Provided information for primary care options.    Patient was advised to follow CDC guidance on social distancing and hand washing.    Advised patient to contact their primary care physician in the event that patient's symptoms change or worsen.      Reminded patient that the emergency room is always available in the event of an emergency.        Natasha Mead, LPC, CCM. NCC  COVID-19 Notification Team  Direct Line:  [phone]  COVID-19 Test Results Call Center:  [phone]
